# Patient Record
Sex: Male | Born: 1965 | Race: White | Hispanic: No | Marital: Married | Smoking: Former smoker
Health system: Southern US, Community
[De-identification: ages and names within clinical notes are randomized; demographics above are authoritative.]

## PROBLEM LIST (undated history)

## (undated) DIAGNOSIS — R011 Cardiac murmur, unspecified: Secondary | ICD-10-CM

## (undated) DIAGNOSIS — I10 Essential (primary) hypertension: Secondary | ICD-10-CM

## (undated) DIAGNOSIS — E785 Hyperlipidemia, unspecified: Secondary | ICD-10-CM

## (undated) HISTORY — DX: Hyperlipidemia, unspecified: E78.5

## (undated) HISTORY — DX: Essential (primary) hypertension: I10

## (undated) HISTORY — DX: Cardiac murmur, unspecified: R01.1

---

## 2005-07-10 ENCOUNTER — Encounter: Admission: RE | Admit: 2005-07-10 | Discharge: 2005-07-10 | Payer: Self-pay | Admitting: Emergency Medicine

## 2010-02-07 ENCOUNTER — Ambulatory Visit: Payer: Self-pay | Admitting: Internal Medicine

## 2010-02-07 DIAGNOSIS — F4010 Social phobia, unspecified: Secondary | ICD-10-CM

## 2010-06-19 NOTE — Assessment & Plan Note (Signed)
Summary: NEW / Mark Gomez  #   Vital Signs:  Patient profile:   45 year old male Height:      67 inches Weight:      188.25 pounds BMI:     29.59 O2 Sat:      97 % on Room air Temp:     97.0 degrees F oral Pulse rate:   72 / minute Pulse rhythm:   regular Resp:     16 per minute BP sitting:   120 / 72  (left arm) Cuff size:   large  Vitals Entered By: Rock Nephew CMA (February 07, 2010 3:58 PM)  Nutrition Counseling: Patient's BMI is greater than 25 and therefore counseled on weight management options.  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 When he goes to church and has to sit in a large crowd he breaks out in a cold sweat and feels nervous. He has an uncontrollable urge to get up and walk outside for some fresh air. This has been occurring for about 20 years and happens rarely but he notes that he has adjusted his life to avoid triggers (crowds, restaurants, parties, malls). He has tried cognitive therapy before but he did not make a good connection with the therapist and so he quit going.  Preventive Screening-Counseling & Management  Alcohol-Tobacco     Alcohol drinks/day: 1     Alcohol type: beer     >5/day in last 3 mos: no     Alcohol Counseling: not indicated; use of alcohol is not excessive or problematic     Feels need to cut down: no     Feels annoyed by complaints: no     Feels guilty re: drinking: no     Needs 'eye opener' in am: no     Smoking Status: never     Tobacco Counseling: not indicated; no tobacco use  Caffeine-Diet-Exercise     Does Patient Exercise: yes  Hep-HIV-STD-Contraception     Hepatitis Risk: no risk noted     HIV Risk: no risk noted     STD Risk: no risk noted      Sexual History:  currently monogamous.        Drug Use:  no.        Blood Transfusions:  no.    Medications Prior to Update: 1)   None  Current Medications (verified): 1)  Propranolol Hcl 10 Mg Tabs (Propranolol Hcl) .... One Tablet By Mouth Two Times A Day As Directed  Allergies (verified): No Known Drug Allergies  Past History:  Past Medical History: Unremarkable  Past Surgical History: Denies surgical history  Family History: none  Social History: Occupation: Curator Married Never Smoked Alcohol use-yes Drug use-no Regular exercise-yes Smoking Status:  never Hepatitis Risk:  no risk noted HIV Risk:  no risk noted STD Risk:  no risk noted Sexual History:  currently monogamous Blood Transfusions:  no Drug Use:  no Does Patient Exercise:  yes  Review of Systems  The patient denies anorexia, fever, weight loss, weight gain, chest pain, syncope, dyspnea on exertion, peripheral edema, prolonged cough, headaches, hemoptysis, abdominal pain, difficulty walking, and depression.   Psych:  Complains of panic attacks; denies alternate hallucination ( auditory/visual), anxiety, depression, easily angered, easily tearful, irritability, sense of great danger, suicidal thoughts/plans, and thoughts /plans of harming others.  Physical Exam  General:  alert, well-developed, well-nourished, well-hydrated, appropriate dress, normal appearance, and healthy-appearing.   Head:  normocephalic, atraumatic, no abnormalities observed, and no abnormalities palpated.   Mouth:  Oral mucosa and oropharynx without lesions or exudates.  Teeth in good repair. Neck:  supple, full ROM, no masses, no thyromegaly, and normal carotid upstroke.   Lungs:  Normal respiratory effort, chest expands symmetrically. Lungs are clear to auscultation, no crackles or wheezes. Heart:  Normal rate and regular rhythm. S1 and S2 normal without gallop, murmur, click, rub or other extra sounds. Abdomen:  soft, non-tender, normal bowel sounds, no distention, no masses, no guarding, no rigidity, no rebound tenderness, no abdominal hernia, no inguinal  hernia, no hepatomegaly, and no splenomegaly.   Msk:  No deformity or scoliosis noted of thoracic or lumbar spine.   Pulses:  R and L carotid,radial,femoral,dorsalis pedis and posterior tibial pulses are full and equal bilaterally Extremities:  No clubbing, cyanosis, edema, or deformity noted with normal full range of motion of all joints.   Neurologic:  No cranial nerve deficits noted. Station and gait are normal. Plantar reflexes are down-going bilaterally. DTRs are symmetrical throughout. Sensory, motor and coordinative functions appear intact. Skin:  Intact without suspicious lesions or rashes Cervical Nodes:  no anterior cervical adenopathy and no posterior cervical adenopathy.   Psych:  Oriented X3, memory intact for recent and remote, normally interactive, good eye contact, not depressed appearing, not agitated, not suicidal, not homicidal, and slightly anxious.     Impression & Recommendations:  Problem # 1:  SOCIAL PHOBIA (ICD-300.23) Assessment New try propranolol Orders: Psychology Referral (Psychology)  Complete Medication List: 1)  Propranolol Hcl 10 Mg Tabs (Propranolol hcl) .... One tablet by mouth two times a day as directed  Patient Instructions: 1)  Please schedule a follow-up appointment in 2 months. 2)  It is important that you exercise regularly at least 20 minutes 5 times a week. If you develop chest pain, have severe difficulty breathing, or feel very tired , stop exercising immediately and seek medical attention. 3)  You need to lose weight. Consider a lower calorie diet and regular exercise.  Prescriptions: PROPRANOLOL HCL 10 MG TABS (PROPRANOLOL HCL) One tablet by mouth two times a day as directed  #60 x 11   Entered and Authorized by:   Etta Grandchild MD   Signed by:   Etta Grandchild MD on 02/07/2010   Method used:   Print then Give to Patient   RxID:   [id number]

## 2015-02-23 ENCOUNTER — Ambulatory Visit
Admission: RE | Admit: 2015-02-23 | Discharge: 2015-02-23 | Disposition: A | Discharge status: Home / Self Care | Payer: BLUE CROSS/BLUE SHIELD | Source: Ambulatory Visit | Attending: Family Medicine | Admitting: Family Medicine

## 2015-02-23 ENCOUNTER — Other Ambulatory Visit: Payer: Self-pay | Admitting: Family Medicine

## 2015-02-23 DIAGNOSIS — R053 Chronic cough: Secondary | ICD-10-CM

## 2015-02-23 DIAGNOSIS — R05 Cough: Secondary | ICD-10-CM

## 2015-03-10 ENCOUNTER — Other Ambulatory Visit: Payer: Self-pay | Admitting: Family Medicine

## 2015-03-10 ENCOUNTER — Ambulatory Visit
Admission: RE | Admit: 2015-03-10 | Discharge: 2015-03-10 | Disposition: A | Discharge status: Home / Self Care | Payer: BLUE CROSS/BLUE SHIELD | Source: Ambulatory Visit | Attending: Family Medicine | Admitting: Family Medicine

## 2015-03-10 DIAGNOSIS — Z09 Encounter for follow-up examination after completed treatment for conditions other than malignant neoplasm: Secondary | ICD-10-CM

## 2016-05-17 IMAGING — CR DG CHEST 2V
2 series · 2 of 2 positions shown · non-contrast
Comparison: None.

CLINICAL DATA: Chronic cough.

EXAM:
CHEST  2 VIEW

[w chest pa]
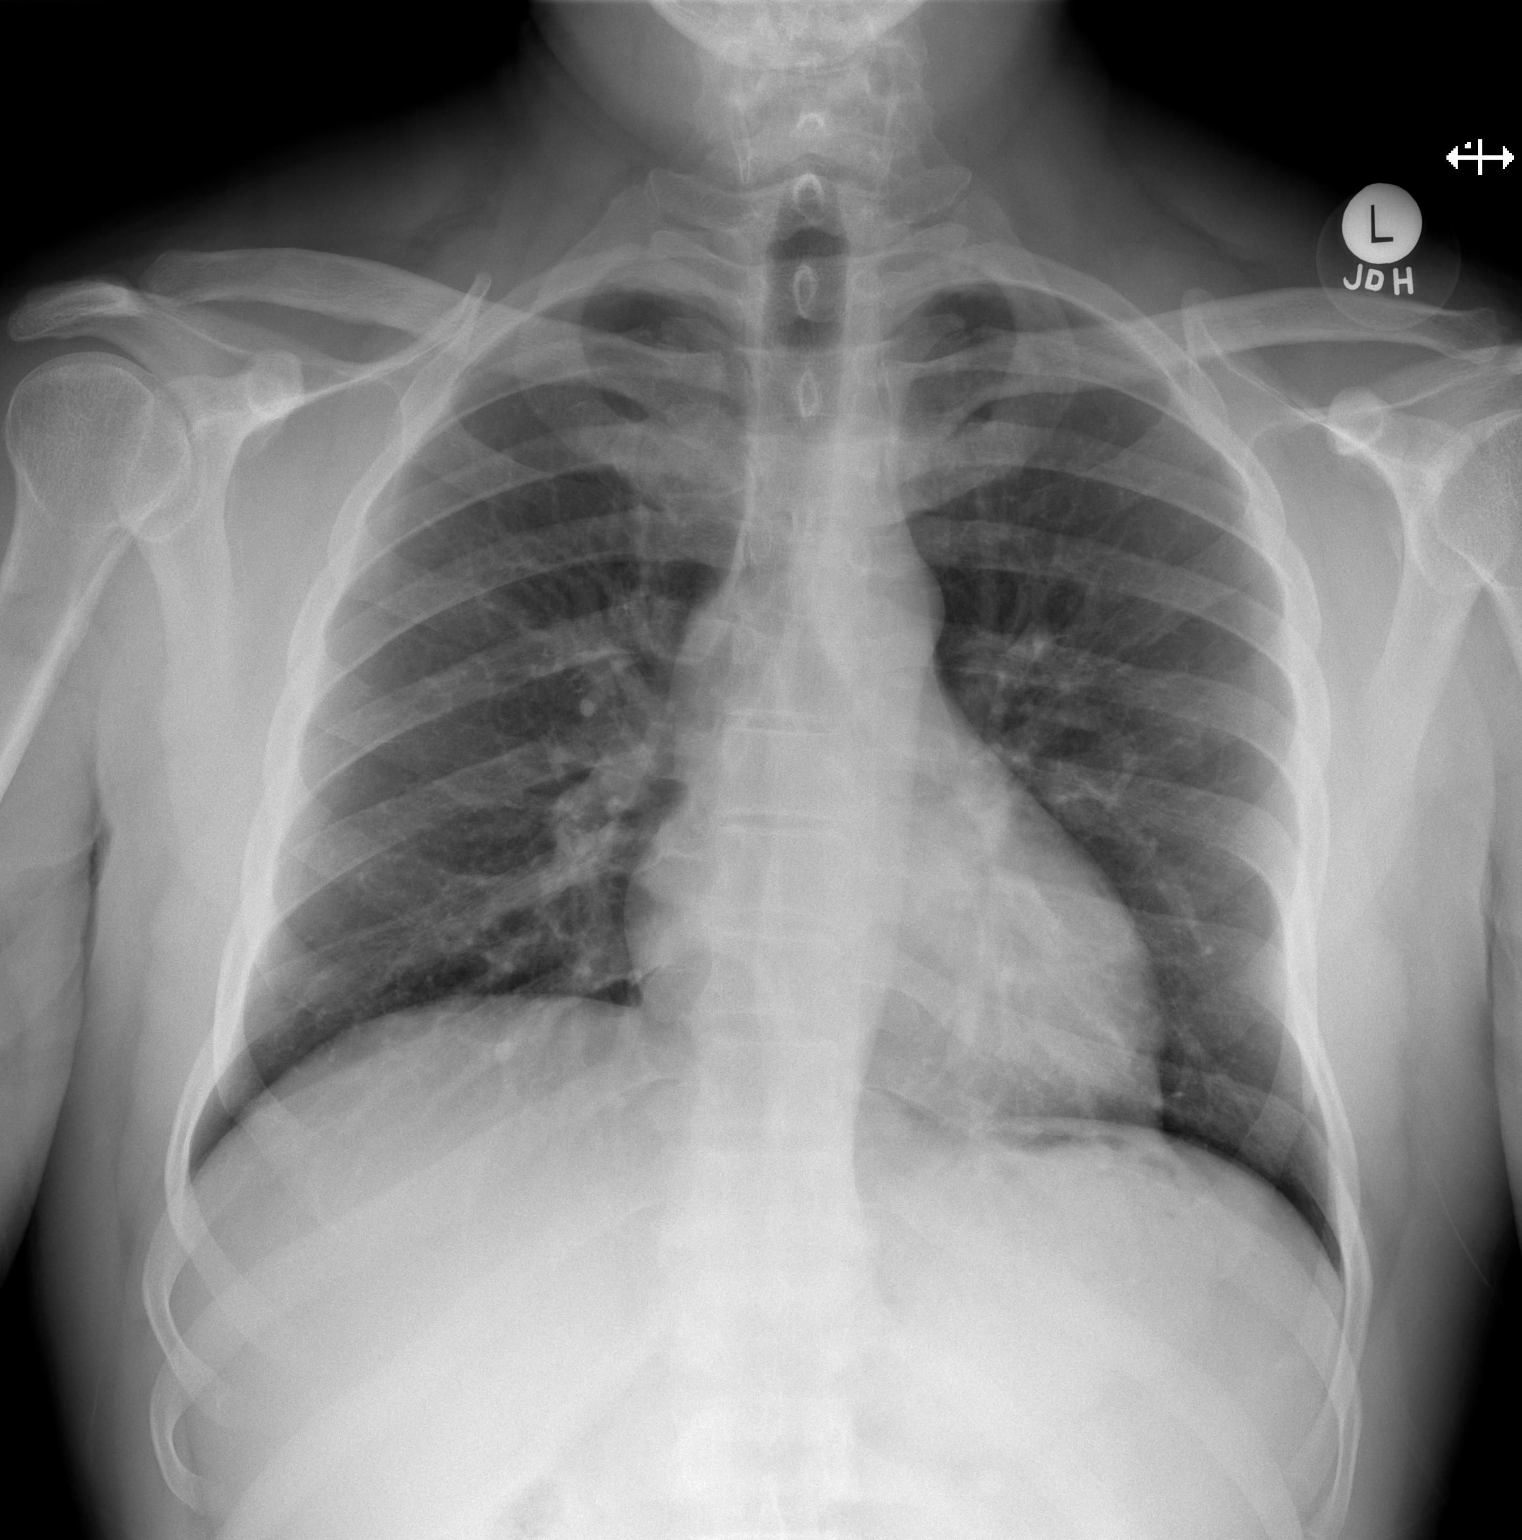

[w chest lat]
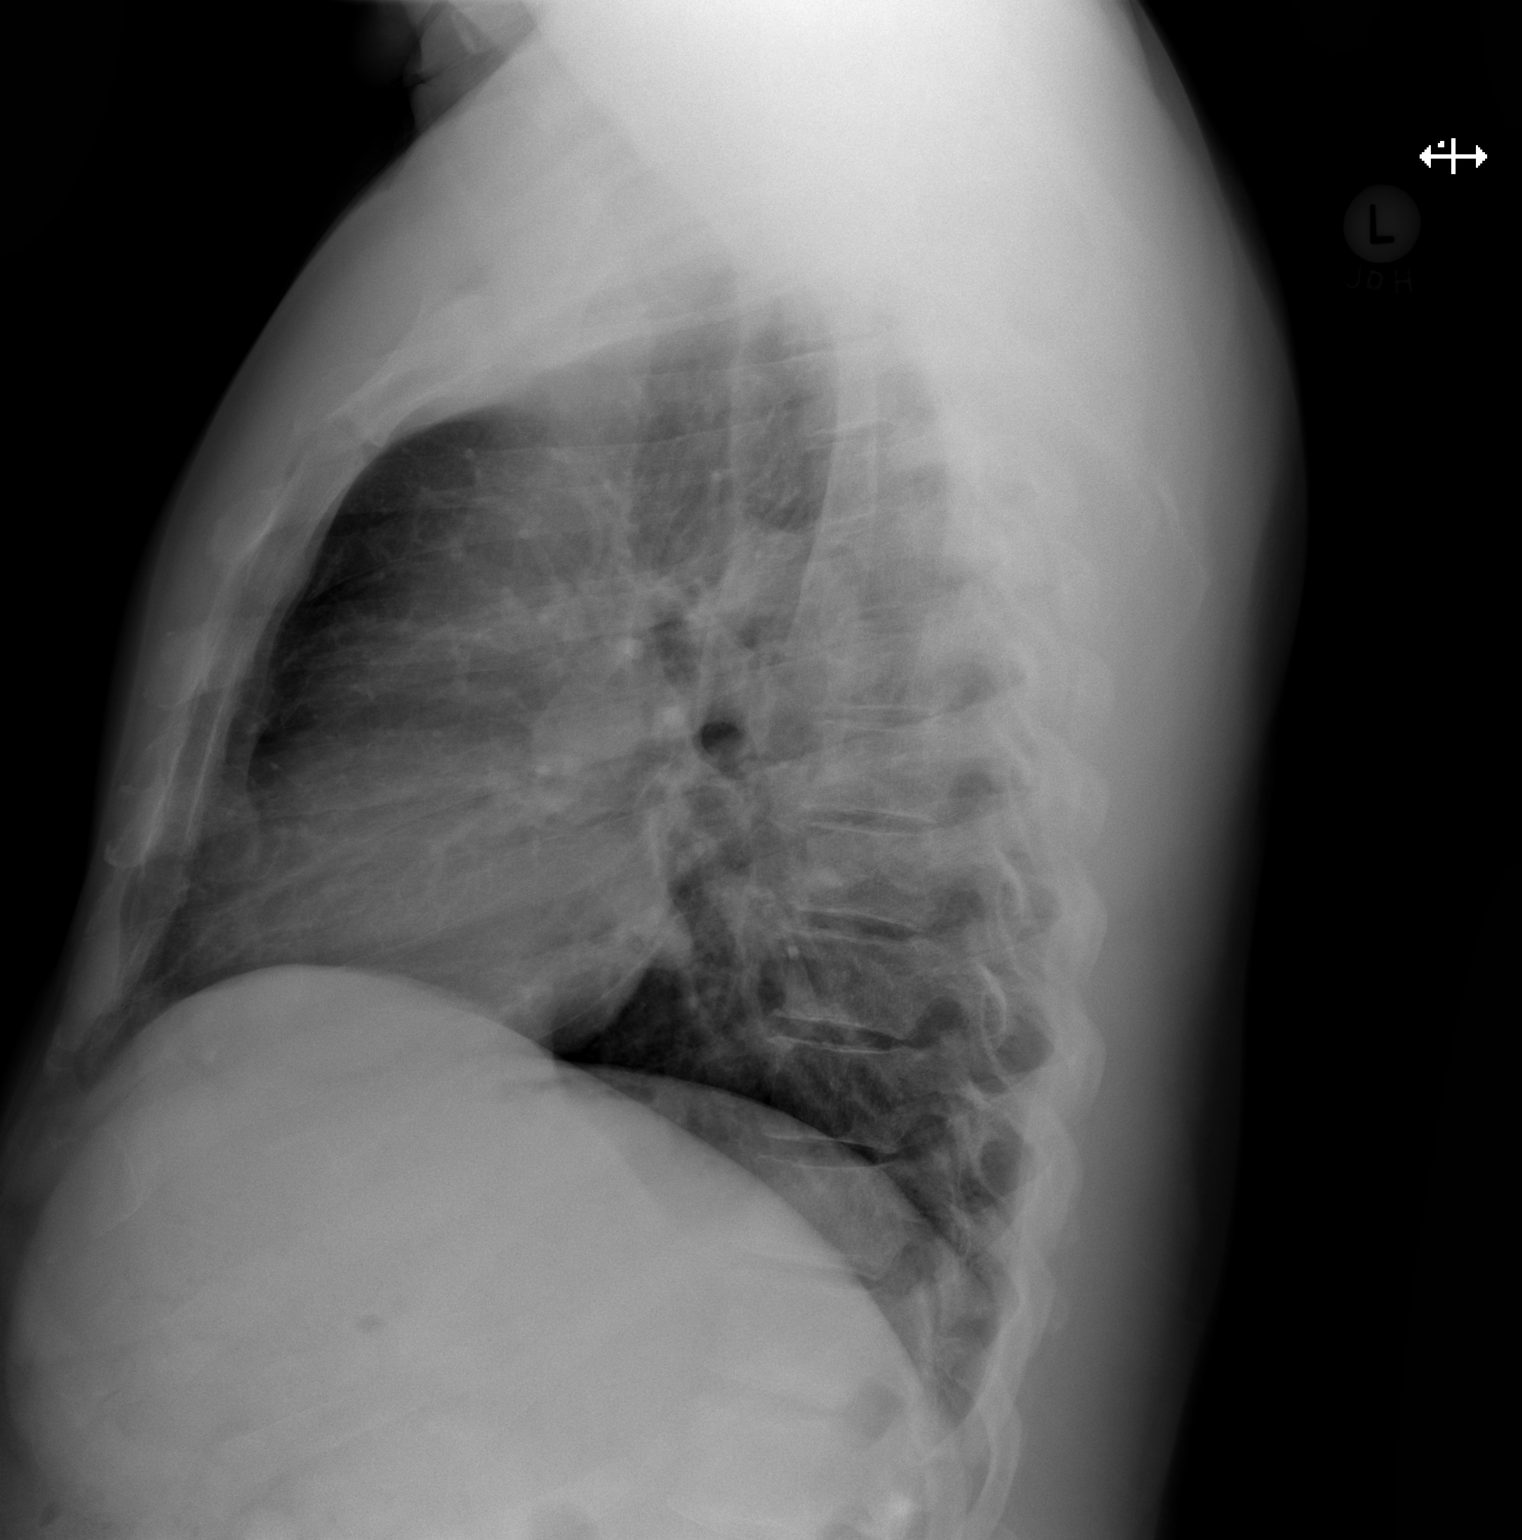

[2 of 2 positions shown; findings below may reference images not displayed]

FINDINGS: Mild increased density is noted along the anterior aspect of the
right first rib. This most likely prominent costosternal
calcification. Apical lordotic chest x-ray suggested to exclude
right upper lobe mass. Mild retrosternal soft tissue prominence,
most likely fat. Lungs are otherwise clear. Heart size normal. No
pleural effusion. No acute bony abnormality.
IMPRESSION: 1. Mild increased density noted along the anterior aspect of the
right first rib. This most likely prominent costosternal
calcification. Apical lordotic chest x-ray suggested to exclude an
underlying small right upper lobe mass.

2.  No acute cardiopulmonary disease.

## 2016-06-01 IMAGING — CR DG CHEST 1V
1 series · 1 of 1 positions shown · non-contrast
Comparison: 02/23/2015

CLINICAL DATA: Right upper lobe density on chest x-ray. Rule out
mass

EXAM:
CHEST 1 VIEW

[w chest ap]
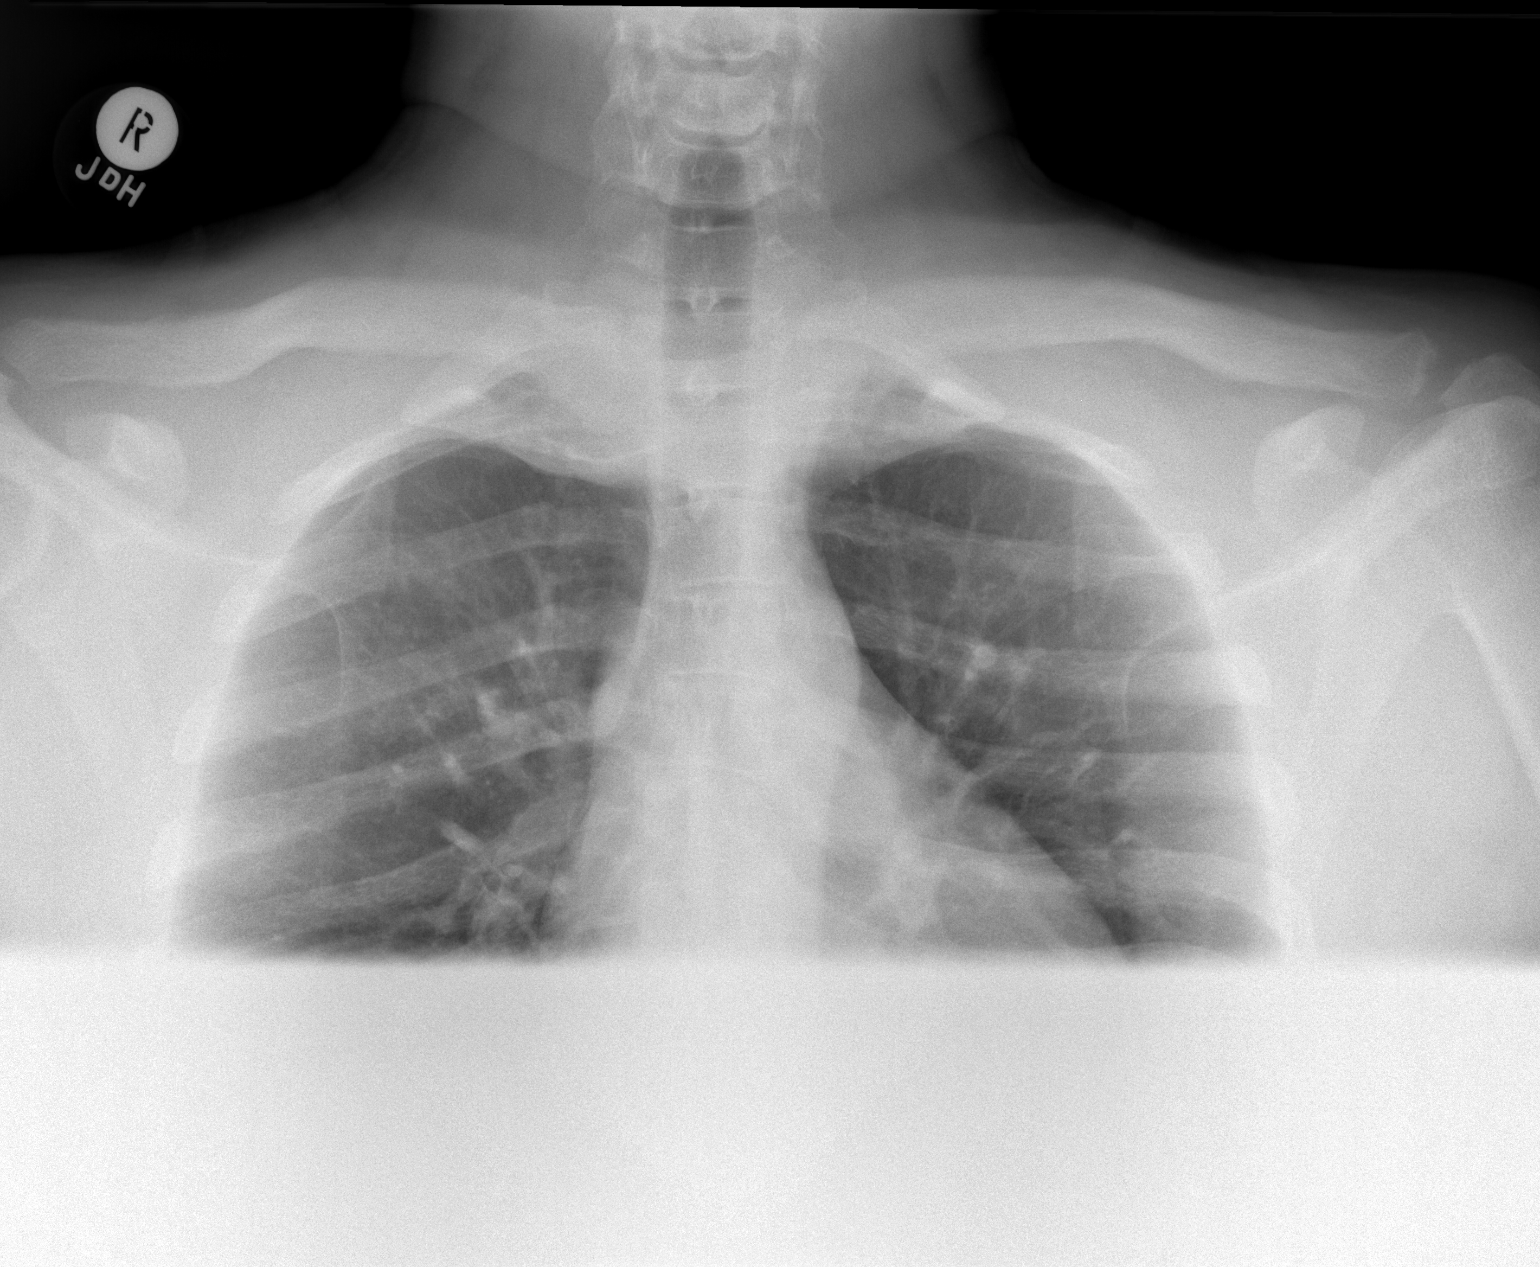

[1 of 1 positions shown; findings below may reference images not displayed]

FINDINGS: Apical lordotic view was obtained. No lung mass or infiltrate is
seen. There is increased density overlying the anterior first ribs
bilaterally due to costochondral calcification. There is a mild
amount of right apical pleural thickening.
IMPRESSION: Asymmetric apical density on the right felt to be due to pleural
thickening and costochondral calcification.

## 2017-01-03 DIAGNOSIS — E291 Testicular hypofunction: Secondary | ICD-10-CM | POA: Diagnosis not present

## 2017-01-03 DIAGNOSIS — I10 Essential (primary) hypertension: Secondary | ICD-10-CM | POA: Diagnosis not present

## 2017-01-03 DIAGNOSIS — Z125 Encounter for screening for malignant neoplasm of prostate: Secondary | ICD-10-CM | POA: Diagnosis not present

## 2017-01-03 DIAGNOSIS — Z5181 Encounter for therapeutic drug level monitoring: Secondary | ICD-10-CM | POA: Diagnosis not present

## 2017-01-03 DIAGNOSIS — R7303 Prediabetes: Secondary | ICD-10-CM | POA: Diagnosis not present

## 2017-01-27 MED FILL — GLYCOPYRROLATE 1 MG TABLET: 1 | 30 days supply | Qty: 60 | Fill #0

## 2017-01-27 MED FILL — LOSARTAN POTASSIUM 50 MG TA: 50 | 30 days supply | Qty: 30 | Fill #0

## 2017-02-12 MED FILL — TESTOSTERONE CYP 200 MG/ML: 200 | 70 days supply | Qty: 10 | Fill #0

## 2017-03-19 MED FILL — LOSARTAN POTASSIUM 50 MG TA: 50 | 30 days supply | Qty: 30 | Fill #1

## 2017-04-04 MED FILL — METOPROLOL TARTRATE 25 MG T: 25 | 30 days supply | Qty: 60 | Fill #0

## 2017-04-18 DIAGNOSIS — I10 Essential (primary) hypertension: Secondary | ICD-10-CM | POA: Diagnosis not present

## 2017-05-07 MED FILL — METOPROLOL TARTRATE 25 MG T: 25 | 90 days supply | Qty: 180 | Fill #0

## 2017-06-26 DIAGNOSIS — Z5181 Encounter for therapeutic drug level monitoring: Secondary | ICD-10-CM | POA: Diagnosis not present

## 2017-06-26 DIAGNOSIS — E291 Testicular hypofunction: Secondary | ICD-10-CM | POA: Diagnosis not present

## 2017-06-26 DIAGNOSIS — Z125 Encounter for screening for malignant neoplasm of prostate: Secondary | ICD-10-CM | POA: Diagnosis not present

## 2017-06-26 DIAGNOSIS — I10 Essential (primary) hypertension: Secondary | ICD-10-CM | POA: Diagnosis not present

## 2017-06-26 DIAGNOSIS — R7303 Prediabetes: Secondary | ICD-10-CM | POA: Diagnosis not present

## 2017-06-27 MED FILL — TESTOSTERONE CYP 200 MG/ML: 200 | 70 days supply | Qty: 10 | Fill #0

## 2017-07-22 DIAGNOSIS — I10 Essential (primary) hypertension: Secondary | ICD-10-CM | POA: Diagnosis not present

## 2017-07-22 DIAGNOSIS — E291 Testicular hypofunction: Secondary | ICD-10-CM | POA: Diagnosis not present

## 2017-09-15 MED FILL — METOPROLOL TARTRATE 25 MG T: 25 | 90 days supply | Qty: 180 | Fill #1

## 2017-10-24 ENCOUNTER — Ambulatory Visit (INDEPENDENT_AMBULATORY_CARE_PROVIDER_SITE_OTHER): Payer: 59 | Admitting: Pulmonary Disease

## 2017-10-24 ENCOUNTER — Encounter: Payer: Self-pay | Admitting: Pulmonary Disease

## 2017-10-24 ENCOUNTER — Ambulatory Visit (INDEPENDENT_AMBULATORY_CARE_PROVIDER_SITE_OTHER)
Admission: RE | Admit: 2017-10-24 | Discharge: 2017-10-24 | Disposition: A | Discharge status: Home / Self Care | Payer: 59 | Source: Ambulatory Visit | Attending: Pulmonary Disease | Admitting: Pulmonary Disease

## 2017-10-24 VITALS — BP 150/90 | HR 67 | Ht 67.0 in | Wt 195.0 lb

## 2017-10-24 DIAGNOSIS — R05 Cough: Secondary | ICD-10-CM

## 2017-10-24 DIAGNOSIS — Z77090 Contact with and (suspected) exposure to asbestos: Secondary | ICD-10-CM | POA: Diagnosis not present

## 2017-10-24 DIAGNOSIS — R059 Cough, unspecified: Secondary | ICD-10-CM

## 2017-10-24 LAB — NITRIC OXIDE: Nitric Oxide: 20

## 2017-10-24 MED FILL — TESTOSTERONE CYP 200 MG/ML: 200 | 70 days supply | Qty: 10 | Fill #1

## 2017-10-24 NOTE — Patient Instructions (Signed)
Cough: We will check a chest x-ray Because of your history of asthma with a cough we will check and exhaled nitric oxide test and a spirometry test If those tests are all normal then I think you should treat this as if it is silent acid reflux: Take Pepcid over-the-counter twice a day for a month Follow the gastroesophageal reflux disease lifestyle modification sheet we gave you  We will see you back in 3 to 4 weeks to see how you are doing

## 2017-10-24 NOTE — Progress Notes (Signed)
Synopsis: Referred in June 2019 for Cough.  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  Briefly smokes cigarettes as a teenager, about 1/2 pack a day for 6 years  Subjective:   PATIENT ID: Mark Gomez GENDER: male DOB: 09-May-1966, MRN: [id number]   HPI  Chief Complaint  Patient presents with  . New Consult    chronic cough    Mark Gomez is here to see me for a cough that has lasted for 4-5 years.  It will come and go.  Typically if he gets a cold it will "go to his chest".  He says that today when he was eating sandwich he had to cough up some mucus production. > typically the cough is dry > he recently had a cold and had some mucus production > it is sometimes yellow > he thinks that the cough has been going on 5-6 years tops > no mold in the house, though symptoms occurred prior to that. He says he does remember an episode with a cough a few weeks ago when he turned on an air conditioning unit in cold air hit him in the face and this made him cough quite a bit.  He smoked briefly as a teenager.    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  He remembers having asthma problems as a kid and used an albuterol like medicine.  He says he grew out of this as a kid.  He raises dogs: keeps hypo-allergenic dogs in the house.  He says he currently has a dogs in the house, they stay most of the time in the garage.  They do not go in the bedroom.  He denies sinus symptoms.  He has noticed no heartburn.   Sometimes he feels a little short breath but this is only with very heavy exertion.    Past Medical History:  Diagnosis Date  . Heart murmur   . Hyperlipidemia   . Hypertension      History reviewed. No pertinent family history.   Social History   Socioeconomic History  . Marital  status: Married    Spouse name: Not on file  . Number of children: Not on file  . Years of education: Not on file  . Highest education level: Not on file  Occupational History  . Not on file  Social Needs  . Financial resource strain: Not on file  . Food insecurity:    Worry: Not on file    Inability: Not on file  . Transportation needs:    Medical: Not on file    Non-medical: Not on file  Tobacco Use  . Smoking status: Former Smoker    Packs/day: 0.50    Years: 6.00    Pack years: 3.00  . Smokeless tobacco: Never Used  Substance and Sexual Activity  . Alcohol use: Not on file  . Drug use: Not on file  . Sexual activity: Not on file  Lifestyle  . Physical activity:    Days per week: Not on file    Minutes per session: Not on file  . Stress: Not on file  Relationships  . Social connections:    Talks on phone: Not on file    Gets together: Not on file  Attends religious service: Not on file    Active member of club or organization: Not on file    Attends meetings of clubs or organizations: Not on file    Relationship status: Not on file  . Intimate partner violence:    Fear of current or ex partner: Not on file    Emotionally abused: Not on file    Physically abused: Not on file    Forced sexual activity: Not on file  Other Topics Concern  . Not on file  Social History Narrative  . Not on file     No Known Allergies   Outpatient Medications Prior to Visit  Medication Sig Dispense Refill  . metoprolol tartrate (LOPRESSOR) 100 MG tablet Take 100 mg by mouth 2 (two) times daily.    Marland Kitchen glycopyrrolate (ROBINUL) 1 MG tablet Take 1 mg by mouth 2 (two) times daily as needed.     No facility-administered medications prior to visit.     Review of Systems  Constitutional: Negative for fever and weight loss.  HENT: Positive for congestion. Negative for ear pain, nosebleeds and sore throat.   Eyes: Negative for redness.  Respiratory: Positive for cough and wheezing.  Negative for shortness of breath.   Cardiovascular: Negative for palpitations, leg swelling and PND.  Gastrointestinal: Negative for nausea and vomiting.  Genitourinary: Negative for dysuria.  Skin: Negative for rash.  Neurological: Negative for headaches.  Endo/Heme/Allergies: Does not bruise/bleed easily.  Psychiatric/Behavioral: Negative for depression. The patient is not nervous/anxious.       Objective:  Physical Exam   Vitals:   10/24/17 1534  BP: (!) 150/90  Pulse: 67  SpO2: 99%  Weight: 195 lb (88.5 kg)  Height: 5\' 7"  (1.702 m)    Gen: well appearing, no acute distress HENT: NCAT, OP clear, neck supple without masses Eyes: PERRL, EOMi Lymph: no cervical lymphadenopathy PULM: CTA B CV: RRR, no mgr, no JVD GI: BS+, soft, nontender, no hsm Derm: no rash or skin breakdown MSK: normal bulk and tone Neuro: A&Ox4, CN II-XII intact, strength 5/5 in all 4 extremities Psyche: normal mood and affect   CBC No results found for: WBC, RBC, HGB, HCT, PLT, MCV, MCH, MCHC, RDW, LYMPHSABS, MONOABS, EOSABS, BASOSABS   Chest imaging: 2016 chest x-ray showed right upper lobe costochondral calcification I have personally reviewed the images from this chest x-ray and I have a difficult time seeing this.  Pulmonary parenchyma seems normal otherwise.  PFT:  Labs:  Path:  Echo:  Heart Catheterization:       Assessment & Plan:   Cough  Asbestos exposure  Discussion: Mark Gomez presents today for evaluation of a cough.  He has a history of asthma as a child and he reports symptoms which are sometimes exacerbated by cold air or eating.  Otherwise the cough is fairly unpredictable.  Because of his history of asthma and the fact that he will get chest symptoms after an upper respiratory infection I would like to assess for asthma.  Further, considering his prior history of heavy asbestos exposure I think we should get a chest x-ray.  Of note there was a read on a chest x-ray  in October 2016 which was apparently abnormal but by my review of the images I cannot find a clear abnormality.  I explained to him today that the causes of cough for many and include postnasal drip, acid reflux and lung disease among others.  In his particular case and most concerned about the  possibility of asthma given his personal history and acid reflux.  Plan: Cough: We will check a chest x-ray Because of your history of asthma with a cough we will check and exhaled nitric oxide test and a spirometry test If those tests are all normal then I think you should treat this as if it is silent acid reflux: Take Pepcid over-the-counter twice a day for a month Follow the gastroesophageal reflux disease lifestyle modification sheet we gave you  We will see you back in 3 to 4 weeks to see how you are doing    Current Outpatient Medications:  .  metoprolol tartrate (LOPRESSOR) 100 MG tablet, Take 100 mg by mouth 2 (two) times daily., Disp: , Rfl:

## 2017-10-27 ENCOUNTER — Telehealth: Payer: Self-pay | Admitting: Pulmonary Disease

## 2017-10-27 NOTE — Telephone Encounter (Signed)
Spoke with patient. He is aware of results. Nothing else needed at time of call.  

## 2017-10-27 NOTE — Telephone Encounter (Signed)
Normal, I looked at the images myself to be sure

## 2017-10-27 NOTE — Telephone Encounter (Signed)
Called pt to see which results he was referring to. He would like the results from his checs xray. I will send message to Dr. Kendrick FriesMcQuaid to review so we can call pt to let him know.   Please advise Dr. Kendrick FriesMcQuaid

## 2017-12-01 ENCOUNTER — Encounter: Payer: Self-pay | Admitting: Pulmonary Disease

## 2017-12-01 ENCOUNTER — Ambulatory Visit (INDEPENDENT_AMBULATORY_CARE_PROVIDER_SITE_OTHER): Payer: 59 | Admitting: Pulmonary Disease

## 2017-12-01 VITALS — BP 132/90 | HR 64 | Ht 67.0 in | Wt 193.0 lb

## 2017-12-01 DIAGNOSIS — R05 Cough: Secondary | ICD-10-CM

## 2017-12-01 DIAGNOSIS — R059 Cough, unspecified: Secondary | ICD-10-CM

## 2017-12-01 NOTE — Patient Instructions (Signed)
Cough: I see no evidence of underlying lung disease I think the cough is due to heightened laryngeal irritation and sensitivity after a viral illness  In the event of a cold I recommend the following:  For sinus congestion: Saline rinses/sprays Over-the-counter phenylephrine If that is not effective then consider using pseudoephedrine  For cough: Use dextromethorphan twice a day  For excessive mucus in your chest or sinuses: Use guaifenesin twice a day  If these treatments are ineffective then come back to see us if the problem recurs again.  At that point we may consider having a speech therapist evaluate your swallowing as you say that swallowing liquid and solids will sometimes exacerbate the cough when you are having problems with it.  Follow-up on an as-needed basis

## 2017-12-01 NOTE — Progress Notes (Signed)
Synopsis: Referred in June 2019 for Cough.  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  Briefly smokes cigarettes as a teenager, about 1/2 pack a day for 6 years  Subjective:   PATIENT ID: Mark Gomez GENDER: male DOB: [birth year], MRN: [id number]   HPI  Chief Complaint  Patient presents with  . Follow-up    cough has been gone for about week     Mark Gomez is doing well.  He says that he took the Pepcid over-the-counter for about a week and a half and he says that he really did not feel that this made much of a difference in his cough so he stopped taking it.  However, around the time he stopped taking the pill he says the cough went away.  He has not had it since then.  He says that he is concerned that this will come back again though because he has had this problem repeatedly over the last several years.  About every 4 to 6 months we will get a cough after a cold that will persist for several months.  He says typically the cough worsens during these episodes when eating or drinking.  Currently he is cough-free.  Past Medical History:  Diagnosis Date  . Heart murmur   . Hyperlipidemia   . Hypertension       Review of Systems  Constitutional: Negative for fever and weight loss.  HENT: Positive for congestion. Negative for ear pain, nosebleeds and sore throat.   Eyes: Negative for redness.  Respiratory: Positive for cough and wheezing. Negative for shortness of breath.   Cardiovascular: Negative for palpitations, leg swelling and PND.  Gastrointestinal: Negative for nausea and vomiting.  Genitourinary: Negative for dysuria.  Skin: Negative for rash.  Neurological: Negative for headaches.  Endo/Heme/Allergies: Does not bruise/bleed easily.  Psychiatric/Behavioral: Negative for depression. The patient is not nervous/anxious.       Objective:  Physical Exam   Vitals:   12/01/17 1006  BP:  132/90  Pulse: 64  SpO2: 98%  Weight: 193 lb (87.5 kg)  Height: 5\' 7"  (1.702 m)    RA  Gen: well appearing HENT: OP clear, TM's clear, neck supple PULM: CTA B, normal percussion CV: RRR, no mgr, trace edema GI: BS+, soft, nontender Derm: no cyanosis or rash Psyche: normal mood and affect    CBC No results found for: WBC, RBC, HGB, HCT, PLT, MCV, MCH, MCHC, RDW, LYMPHSABS, MONOABS, EOSABS, BASOSABS   Chest imaging: 2016 chest x-ray showed right upper lobe costochondral calcification I have personally reviewed the images from this chest x-ray and I have a difficult time seeing this.  Pulmonary parenchyma seems normal otherwise. 2019 CXR images reviewed: normal  PFT: 10/2017 Spirometry: normal  FENO: 10/2017 20 ppm  Labs:  Path:  Echo:  Heart Catheterization:       Assessment & Plan:   Cough  Discussion: Mark Gomez has no evidence of an underlying lung disease which could be causing his recurrent problem with cough after viral illnesses.  Treatment of acid reflux did not make much of a difference, the cough does seem to go away spontaneously.  I explained to him today that I think he has a heightened laryngeal sense of irritation whenever he gets a cough or cold.  So I think the best form of treatment moving forward is to be aggressive with symptomatic therapy in the event of a  viral illness.  Because he says the cough will sometimes be exacerbated by swallowing difficulty there may be value in having a speech therapist see him if this problem recurs again.  Plan: Cough: I see no evidence of underlying lung disease I think the cough is due to heightened laryngeal irritation and sensitivity after a viral illness  In the event of a cold I recommend the following:  For sinus congestion: Saline rinses/sprays Over-the-counter phenylephrine If that is not effective then consider using pseudoephedrine  For cough: Use dextromethorphan twice a day  For excessive  mucus in your chest or sinuses: Use guaifenesin twice a day  If these treatments are ineffective then come back to see [place] if the problem recurs again.  At that point we may consider having a speech therapist evaluate your swallowing as you say that swallowing liquid and solids will sometimes exacerbate the cough when you are having problems with it.  Follow-up on an as-needed basis   Current Outpatient Medications:  .  metoprolol tartrate (LOPRESSOR) 100 MG tablet, Take 100 mg by mouth 2 (two) times daily., Disp: , Rfl:  .  testosterone cypionate (DEPOTESTOTERONE CYPIONATE) 100 MG/ML injection, Inject 200 mg into the muscle every 14 (fourteen) days. For IM use only, Disp: , Rfl:

## 2017-12-26 DIAGNOSIS — E291 Testicular hypofunction: Secondary | ICD-10-CM | POA: Diagnosis not present

## 2017-12-26 DIAGNOSIS — I10 Essential (primary) hypertension: Secondary | ICD-10-CM | POA: Diagnosis not present

## 2017-12-26 DIAGNOSIS — Z125 Encounter for screening for malignant neoplasm of prostate: Secondary | ICD-10-CM | POA: Diagnosis not present

## 2017-12-26 DIAGNOSIS — Z79899 Other long term (current) drug therapy: Secondary | ICD-10-CM | POA: Diagnosis not present

## 2017-12-29 DIAGNOSIS — E291 Testicular hypofunction: Secondary | ICD-10-CM | POA: Diagnosis not present

## 2017-12-29 DIAGNOSIS — Z79899 Other long term (current) drug therapy: Secondary | ICD-10-CM | POA: Diagnosis not present

## 2017-12-29 DIAGNOSIS — Z125 Encounter for screening for malignant neoplasm of prostate: Secondary | ICD-10-CM | POA: Diagnosis not present

## 2018-01-15 MED FILL — METOPROLOL TARTRATE 25 MG T: 25 | 90 days supply | Qty: 180 | Fill #0

## 2018-01-20 DIAGNOSIS — I10 Essential (primary) hypertension: Secondary | ICD-10-CM | POA: Diagnosis not present

## 2018-03-30 MED FILL — TESTOSTERONE CYP 200 MG/ML: 200 | 70 days supply | Qty: 10 | Fill #0

## 2018-05-08 MED FILL — METOPROLOL TARTRATE 25 MG T: 25 | 90 days supply | Qty: 180 | Fill #0

## 2018-07-20 MED FILL — TESTOSTERONE CYP 200 MG/ML: 200 | 70 days supply | Qty: 10 | Fill #1

## 2018-07-22 MED FILL — GLYCOPYRROLATE 1 MG TABLET: 1 | 90 days supply | Qty: 180 | Fill #0

## 2018-08-27 MED FILL — METOPROLOL TARTRATE 25 MG T: 25 | 90 days supply | Qty: 180 | Fill #0

## 2018-09-28 MED FILL — METHOCARBAMOL 500 MG TABLET: 500 | 5 days supply | Qty: 20 | Fill #0

## 2018-12-24 MED FILL — METOPROLOL TARTRATE 25 MG T: 25 | 90 days supply | Qty: 180 | Fill #0

## 2019-03-15 MED FILL — TESTOSTERONE CYP 200 MG/ML: 200 | 70 days supply | Qty: 10 | Fill #0

## 2019-04-26 MED FILL — METOPROLOL TARTRATE 25 MG T: 25 | 90 days supply | Qty: 180 | Fill #0

## 2019-07-29 MED FILL — TESTOSTERONE CYP 200 MG/ML: 200 | 70 days supply | Qty: 10 | Fill #1

## 2019-08-11 MED FILL — TESTOSTERONE CYP 200 MG/ML: 200 | 70 days supply | Qty: 10 | Fill #1

## 2019-08-23 MED FILL — METOPROLOL TARTRATE 25 MG T: 25 | 90 days supply | Qty: 180 | Fill #1

## 2019-12-09 ENCOUNTER — Other Ambulatory Visit (HOSPITAL_COMMUNITY): Payer: Self-pay | Admitting: Family Medicine

## 2019-12-09 MED FILL — METOPROLOL TARTRATE 25 MG T: 25 | 90 days supply | Qty: 180 | Fill #0

## 2019-12-15 MED FILL — METOPROLOL TARTRATE 25 MG T: 25 | 90 days supply | Qty: 180 | Fill #0

## 2019-12-15 MED FILL — METOPROLOL TARTRATE 25 MG T: 25 | 90 days supply | Qty: 180 | Fill #0 | Status: TO

## 2020-02-10 MED FILL — TESTOSTERONE CYP 200 MG/ML: 200 | 84 days supply | Qty: 12 | Fill #0

## 2020-02-29 ENCOUNTER — Other Ambulatory Visit (HOSPITAL_COMMUNITY): Payer: Self-pay | Admitting: Family Medicine

## 2020-03-02 MED FILL — GLYCOPYRROLATE 1 MG TABLET: 1 | 90 days supply | Qty: 180 | Fill #0

## 2020-04-19 MED FILL — METOPROLOL TARTRATE 25 MG T: 25 | 90 days supply | Qty: 180 | Fill #1

## 2020-05-09 MED FILL — TESTOSTERONE CYP 200 MG/ML: 200 | 56 days supply | Qty: 8 | Fill #1

## 2020-08-02 ENCOUNTER — Other Ambulatory Visit (HOSPITAL_COMMUNITY): Payer: Self-pay | Admitting: Family Medicine

## 2020-08-17 ENCOUNTER — Other Ambulatory Visit (HOSPITAL_COMMUNITY): Payer: Self-pay | Admitting: Nurse Practitioner

## 2020-08-17 MED FILL — METHOCARBAMOL 500 MG TABS: 500 | 5 days supply | Qty: 20 | Fill #0

## 2020-08-17 MED FILL — predniSONE 10 MG TABS: 10 | 6 days supply | Qty: 21 | Fill #0

## 2020-08-18 ENCOUNTER — Other Ambulatory Visit (HOSPITAL_COMMUNITY): Payer: Self-pay | Admitting: Nurse Practitioner

## 2020-08-18 MED FILL — traMADol HCL 50 MG TABS: 50 | 30 days supply | Qty: 60 | Fill #0

## 2020-09-07 ENCOUNTER — Other Ambulatory Visit (HOSPITAL_COMMUNITY): Payer: Self-pay

## 2020-09-08 ENCOUNTER — Other Ambulatory Visit (HOSPITAL_COMMUNITY): Payer: Self-pay

## 2020-09-08 MED ORDER — METOPROLOL TARTRATE 25 MG PO TABS
25.0000 mg | ORAL_TABLET | Freq: Two times a day (BID) | ORAL | 0 refills | Status: DC
  Filled 2020-09-08: qty 180, 90d supply, fill #0

## 2020-09-12 ENCOUNTER — Other Ambulatory Visit (HOSPITAL_COMMUNITY): Payer: Self-pay

## 2021-01-08 ENCOUNTER — Other Ambulatory Visit (HOSPITAL_COMMUNITY): Payer: Self-pay

## 2021-01-08 MED ORDER — METOPROLOL TARTRATE 25 MG PO TABS
25.0000 mg | ORAL_TABLET | Freq: Two times a day (BID) | ORAL | 0 refills | Status: DC
  Filled 2021-01-08: qty 180, 90d supply, fill #0

## 2021-01-08 MED FILL — Glycopyrrolate Tab 1 MG: ORAL | 90 days supply | Qty: 180 | Fill #0 | Status: AC

## 2021-01-09 ENCOUNTER — Other Ambulatory Visit (HOSPITAL_COMMUNITY): Payer: Self-pay

## 2021-04-05 ENCOUNTER — Other Ambulatory Visit (HOSPITAL_COMMUNITY): Payer: Self-pay

## 2021-04-05 MED ORDER — TESTOSTERONE CYPIONATE 200 MG/ML IM SOLN
200.0000 mg | INTRAMUSCULAR | 1 refills | Status: DC
  Filled 2021-04-05: qty 12, 84d supply, fill #0
  Filled 2021-08-20: qty 6, 84d supply, fill #1

## 2021-04-16 ENCOUNTER — Other Ambulatory Visit (HOSPITAL_COMMUNITY): Payer: Self-pay

## 2021-04-16 MED ORDER — METOPROLOL TARTRATE 25 MG PO TABS
25.0000 mg | ORAL_TABLET | Freq: Two times a day (BID) | ORAL | 0 refills | Status: DC
  Filled 2021-04-16: qty 180, 90d supply, fill #0

## 2021-08-09 ENCOUNTER — Other Ambulatory Visit (HOSPITAL_COMMUNITY): Payer: Self-pay

## 2021-08-10 ENCOUNTER — Other Ambulatory Visit (HOSPITAL_COMMUNITY): Payer: Self-pay

## 2021-08-10 MED ORDER — METOPROLOL TARTRATE 25 MG PO TABS
25.0000 mg | ORAL_TABLET | Freq: Two times a day (BID) | ORAL | 0 refills | Status: DC
  Filled 2021-08-10: qty 180, 90d supply, fill #0

## 2021-08-20 ENCOUNTER — Other Ambulatory Visit (HOSPITAL_COMMUNITY): Payer: Self-pay

## 2021-08-21 ENCOUNTER — Other Ambulatory Visit (HOSPITAL_COMMUNITY): Payer: Self-pay

## 2021-08-23 ENCOUNTER — Other Ambulatory Visit (HOSPITAL_COMMUNITY): Payer: Self-pay

## 2021-08-24 ENCOUNTER — Other Ambulatory Visit (HOSPITAL_COMMUNITY): Payer: Self-pay

## 2021-08-27 ENCOUNTER — Other Ambulatory Visit (HOSPITAL_COMMUNITY): Payer: Self-pay

## 2021-08-27 MED ORDER — GLYCOPYRROLATE 1 MG PO TABS
1.0000 mg | ORAL_TABLET | Freq: Two times a day (BID) | ORAL | 1 refills | Status: DC
  Filled 2021-08-27: qty 180, 90d supply, fill #0

## 2021-08-28 ENCOUNTER — Other Ambulatory Visit (HOSPITAL_COMMUNITY): Payer: Self-pay

## 2021-09-25 ENCOUNTER — Other Ambulatory Visit (HOSPITAL_COMMUNITY): Payer: Self-pay

## 2021-09-25 MED ORDER — AMLODIPINE BESYLATE 5 MG PO TABS
5.0000 mg | ORAL_TABLET | Freq: Every day | ORAL | 1 refills | Status: DC
  Filled 2021-09-25: qty 30, 30d supply, fill #0

## 2021-10-22 ENCOUNTER — Other Ambulatory Visit (HOSPITAL_COMMUNITY): Payer: Self-pay

## 2021-10-22 MED ORDER — HYDROCHLOROTHIAZIDE 12.5 MG PO CAPS
12.5000 mg | ORAL_CAPSULE | Freq: Every morning | ORAL | 2 refills | Status: DC
  Filled 2021-10-22: qty 30, 30d supply, fill #0

## 2021-10-23 ENCOUNTER — Other Ambulatory Visit (HOSPITAL_BASED_OUTPATIENT_CLINIC_OR_DEPARTMENT_OTHER): Payer: Self-pay

## 2021-11-02 ENCOUNTER — Other Ambulatory Visit (HOSPITAL_COMMUNITY): Payer: Self-pay

## 2021-11-02 MED ORDER — CLONIDINE HCL 0.1 MG PO TABS
0.1000 mg | ORAL_TABLET | Freq: Two times a day (BID) | ORAL | 0 refills | Status: DC
  Filled 2021-11-02: qty 60, 30d supply, fill #0

## 2021-11-19 ENCOUNTER — Other Ambulatory Visit (HOSPITAL_COMMUNITY): Payer: Self-pay

## 2021-11-21 ENCOUNTER — Other Ambulatory Visit (HOSPITAL_COMMUNITY): Payer: Self-pay

## 2021-11-21 MED ORDER — METOPROLOL TARTRATE 25 MG PO TABS
25.0000 mg | ORAL_TABLET | Freq: Two times a day (BID) | ORAL | 0 refills | Status: DC
  Filled 2021-11-21: qty 180, 90d supply, fill #0

## 2022-01-02 ENCOUNTER — Other Ambulatory Visit (HOSPITAL_COMMUNITY): Payer: Self-pay

## 2022-01-02 MED ORDER — TESTOSTERONE CYPIONATE 200 MG/ML IM SOLN
200.0000 mg | INTRAMUSCULAR | 1 refills | Status: DC
  Filled 2022-01-02: qty 6, 84d supply, fill #0
  Filled 2022-03-19 – 2022-03-25 (×2): qty 6, 84d supply, fill #1

## 2022-03-13 ENCOUNTER — Other Ambulatory Visit: Payer: Self-pay | Admitting: Family Medicine

## 2022-03-13 DIAGNOSIS — K439 Ventral hernia without obstruction or gangrene: Secondary | ICD-10-CM

## 2022-03-19 ENCOUNTER — Other Ambulatory Visit (HOSPITAL_COMMUNITY): Payer: Self-pay

## 2022-03-20 ENCOUNTER — Other Ambulatory Visit (HOSPITAL_COMMUNITY): Payer: Self-pay

## 2022-03-20 ENCOUNTER — Ambulatory Visit
Admission: RE | Admit: 2022-03-20 | Discharge: 2022-03-20 | Disposition: A | Discharge status: Home / Self Care | Payer: No Typology Code available for payment source | Source: Ambulatory Visit | Attending: Family Medicine | Admitting: Family Medicine

## 2022-03-20 DIAGNOSIS — K439 Ventral hernia without obstruction or gangrene: Secondary | ICD-10-CM

## 2022-03-25 ENCOUNTER — Other Ambulatory Visit (HOSPITAL_COMMUNITY): Payer: Self-pay

## 2022-03-27 ENCOUNTER — Other Ambulatory Visit: Payer: Self-pay | Admitting: *Deleted

## 2022-03-27 DIAGNOSIS — K429 Umbilical hernia without obstruction or gangrene: Secondary | ICD-10-CM

## 2022-04-08 ENCOUNTER — Other Ambulatory Visit: Payer: Self-pay | Admitting: *Deleted

## 2022-04-08 DIAGNOSIS — K429 Umbilical hernia without obstruction or gangrene: Secondary | ICD-10-CM

## 2022-04-16 ENCOUNTER — Encounter: Payer: Self-pay | Admitting: General Surgery

## 2022-04-16 ENCOUNTER — Ambulatory Visit (INDEPENDENT_AMBULATORY_CARE_PROVIDER_SITE_OTHER): Payer: No Typology Code available for payment source | Admitting: General Surgery

## 2022-04-16 VITALS — BP 155/98 | HR 67 | Temp 97.7°F | Resp 14 | Ht 67.0 in | Wt 180.0 lb

## 2022-04-16 DIAGNOSIS — K429 Umbilical hernia without obstruction or gangrene: Secondary | ICD-10-CM | POA: Diagnosis not present

## 2022-04-16 NOTE — Patient Instructions (Signed)
Umbilical Hernia, Adult  A hernia is a bulge of tissue that pushes through an opening between muscles. An umbilical hernia happens in the abdomen, near the belly button (umbilicus). The hernia may contain tissues from the small intestine, large intestine, or fatty tissue covering the intestines. Umbilical hernias in adults tend to get worse over time, and they require surgical treatment. There are different types of umbilical hernias, including: Indirect hernia. This type is located just above or below the umbilicus. It is the most common type of umbilical hernia in adults. Direct hernia. This type forms through an opening formed by the umbilicus. Reducible hernia. This type of hernia comes and goes. It may be visible only when you strain, lift something heavy, or cough. This type of hernia can be pushed back into the abdomen (reduced). Incarcerated hernia. This type traps abdominal tissue inside the hernia. This type of hernia cannot be reduced. Strangulated hernia. This type of hernia cuts off blood flow to the tissues inside the hernia. The tissues can start to die if this happens. This type of hernia requires emergency treatment. What are the causes? An umbilical hernia happens when tissue inside the abdomen presses on a weak area of the abdominal muscles. What increases the risk? You may have a greater risk of this condition if you: Are obese. Have had several pregnancies. Have a buildup of fluid inside your abdomen. Have had surgery that weakens the abdominal muscles. What are the signs or symptoms? The main symptom of this condition is a painless bulge at or near the belly button. A reducible hernia may be visible only when you strain, lift something heavy, or cough. Other symptoms may include: Dull pain. A feeling of pressure. Symptoms of a strangulated hernia may include: Pain that gets increasingly worse. Nausea and vomiting. Pain when pressing on the hernia. Skin over the hernia  becoming red or purple. Constipation. Blood in the stool. How is this diagnosed? This condition may be diagnosed based on: A physical exam. You may be asked to cough or strain while standing. These actions increase the pressure inside your abdomen and can force the hernia through the opening in your muscles. Your health care provider may try to reduce the hernia by pressing on it. Your symptoms and medical history. How is this treated? Surgery is the only treatment for an umbilical hernia. Surgery for a strangulated hernia is done as soon as possible. If you have a small hernia that is not incarcerated, you may need to lose weight before having surgery. Follow these instructions at home: Lose weight, if told by your health care provider. Do not try to push the hernia back in. Watch your hernia for any changes in color or size. Tell your health care provider if any changes occur. You may need to avoid activities that increase pressure on your hernia. Do not lift anything that is heavier than 10 lb (4.5 kg), or the limit that you are told, until your health care provider says that it is safe. Take over-the-counter and prescription medicines only as told by your health care provider. Keep all follow-up visits. This is important. Contact a health care provider if: Your hernia gets larger. Your hernia becomes painful. Get help right away if: You develop sudden, severe pain near the area of your hernia. You have pain as well as nausea or vomiting. You have pain and the skin over your hernia changes color. You develop a fever or chills. Summary A hernia is a bulge of   tissue that pushes through an opening between muscles. An umbilical hernia happens near the belly button. Surgery is the only treatment for an umbilical hernia. Do not try to push your hernia back in. Keep all follow-up visits. This is important. This information is not intended to replace advice given to you by your health care  provider. Make sure you discuss any questions you have with your health care provider. Document Revised: 12/13/2019 Document Reviewed: 12/13/2019 Elsevier Patient Education  2023 [employer]  

## 2022-04-16 NOTE — Progress Notes (Unsigned)
[facility] History and Physical  Reason for Referral:*** Referring Physician: ***  Chief Complaint   New Patient (Initial Visit)     Mark Gomez is a 56 y.o. male.  HPI: ***.  The *** started *** and has had a duration of ***.  It is associated with ***.  The *** is improved with ***, and is made worse with ***.    Quality*** Context***  Past Medical History:  Diagnosis Date   Heart murmur    Hyperlipidemia    Hypertension     No past surgical history on file.  No family history on file.  Social History   Tobacco Use   Smoking status: Former    Packs/day: 0.50    Years: 6.00    Total pack years: 3.00    Types: Cigarettes    Quit date: 05/20/1990    Years since quitting: 31.9   Smokeless tobacco: Never    Medications: {medication reviewed/display:3041432} Allergies as of 04/16/2022   No Known Allergies      Medication List        Accurate as of April 16, 2022 11:57 AM. If you have any questions, ask your nurse or doctor.          STOP taking these medications    amLODipine 5 MG tablet Commonly known as: NORVASC Stopped by: Lucretia Roers, MD   cloNIDine 0.1 MG tablet Commonly known as: CATAPRES Stopped by: Lucretia Roers, MD   glycopyrrolate 1 MG tablet Commonly known as: ROBINUL Stopped by: Lucretia Roers, MD   hydrochlorothiazide 12.5 MG capsule Commonly known as: MICROZIDE Stopped by: Lucretia Roers, MD   metoprolol tartrate 100 MG tablet Commonly known as: LOPRESSOR Stopped by: Lucretia Roers, MD   metoprolol tartrate 25 MG tablet Commonly known as: LOPRESSOR Stopped by: Lucretia Roers, MD       TAKE these medications    testosterone cypionate 100 MG/ML injection Commonly known as: DEPOTESTOTERONE CYPIONATE Inject 200 mg into the muscle every 14 (fourteen) days. For IM use only What changed: Another medication with the same name was removed. Continue taking this medication, and  follow the directions you see here. Changed by: Lucretia Roers, MD         ROS:  {Review of Systems:30496}  Blood pressure (!) 155/98, pulse 67, temperature 97.7 F (36.5 C), temperature source Oral, resp. rate 14, height 5\' 7"  (1.702 m), weight 180 lb (81.6 kg), SpO2 97 %. Physical Exam  Results: No results found for this or any previous visit (from the past 48 hour(s)).  No results found.   Assessment & Plan:  Mark Gomez is a 56 y.o. male with *** -*** -*** -Follow up ***  All questions were answered to the satisfaction of the patient and family***.  The risk and benefits of *** were discussed including but not limited to ***.  After careful consideration, Mark Gomez has decided to ***.    Ignacia Marvel 04/16/2022, 11:57 AM

## 2022-05-07 ENCOUNTER — Other Ambulatory Visit (HOSPITAL_COMMUNITY): Payer: Self-pay

## 2022-07-08 ENCOUNTER — Other Ambulatory Visit (HOSPITAL_COMMUNITY): Payer: Self-pay

## 2022-07-10 ENCOUNTER — Other Ambulatory Visit (HOSPITAL_COMMUNITY): Payer: Self-pay

## 2022-07-10 MED ORDER — TESTOSTERONE CYPIONATE 200 MG/ML IM SOLN
200.0000 mg | INTRAMUSCULAR | 1 refills | Status: AC
  Filled 2022-07-10 – 2022-07-24 (×2): qty 6, 84d supply, fill #0

## 2022-07-12 ENCOUNTER — Other Ambulatory Visit (HOSPITAL_COMMUNITY): Payer: Self-pay

## 2022-07-22 ENCOUNTER — Other Ambulatory Visit (HOSPITAL_COMMUNITY): Payer: Self-pay

## 2022-07-24 ENCOUNTER — Other Ambulatory Visit (HOSPITAL_COMMUNITY): Payer: Self-pay

## 2022-07-25 ENCOUNTER — Other Ambulatory Visit (HOSPITAL_COMMUNITY): Payer: Self-pay

## 2022-10-08 ENCOUNTER — Other Ambulatory Visit (HOSPITAL_COMMUNITY): Payer: Self-pay

## 2022-10-08 DIAGNOSIS — R7303 Prediabetes: Secondary | ICD-10-CM | POA: Diagnosis not present

## 2022-10-08 DIAGNOSIS — E291 Testicular hypofunction: Secondary | ICD-10-CM | POA: Diagnosis not present

## 2022-10-08 DIAGNOSIS — Z125 Encounter for screening for malignant neoplasm of prostate: Secondary | ICD-10-CM | POA: Diagnosis not present

## 2022-10-08 MED ORDER — TESTOSTERONE CYPIONATE 200 MG/ML IM SOLN
200.0000 mg | INTRAMUSCULAR | 1 refills | Status: DC
  Filled 2022-10-08: qty 7, 90d supply, fill #0
  Filled 2022-10-15: qty 7, 98d supply, fill #0
  Filled 2022-10-16: qty 6, 84d supply, fill #0
  Filled 2023-02-03: qty 6, 84d supply, fill #1

## 2022-10-10 ENCOUNTER — Other Ambulatory Visit (HOSPITAL_COMMUNITY): Payer: Self-pay

## 2022-10-11 ENCOUNTER — Other Ambulatory Visit (HOSPITAL_COMMUNITY): Payer: Self-pay

## 2022-10-11 MED ORDER — GLYCOPYRROLATE 1 MG PO TABS
1.0000 mg | ORAL_TABLET | Freq: Two times a day (BID) | ORAL | 1 refills | Status: AC
  Filled 2022-10-11: qty 180, 90d supply, fill #0

## 2022-10-15 ENCOUNTER — Other Ambulatory Visit: Payer: Self-pay

## 2022-10-15 ENCOUNTER — Other Ambulatory Visit (HOSPITAL_COMMUNITY): Payer: Self-pay

## 2022-10-16 ENCOUNTER — Other Ambulatory Visit (HOSPITAL_COMMUNITY): Payer: Self-pay

## 2023-02-03 ENCOUNTER — Other Ambulatory Visit (HOSPITAL_COMMUNITY): Payer: Self-pay

## 2023-02-06 ENCOUNTER — Other Ambulatory Visit (HOSPITAL_COMMUNITY): Payer: Self-pay

## 2023-03-27 DIAGNOSIS — R7303 Prediabetes: Secondary | ICD-10-CM | POA: Diagnosis not present

## 2023-03-27 DIAGNOSIS — Z13220 Encounter for screening for lipoid disorders: Secondary | ICD-10-CM | POA: Diagnosis not present

## 2023-03-27 DIAGNOSIS — Z21 Asymptomatic human immunodeficiency virus [HIV] infection status: Secondary | ICD-10-CM | POA: Diagnosis not present

## 2023-03-27 DIAGNOSIS — E291 Testicular hypofunction: Secondary | ICD-10-CM | POA: Diagnosis not present

## 2023-03-27 DIAGNOSIS — Z125 Encounter for screening for malignant neoplasm of prostate: Secondary | ICD-10-CM | POA: Diagnosis not present

## 2023-04-08 DIAGNOSIS — E291 Testicular hypofunction: Secondary | ICD-10-CM | POA: Diagnosis not present

## 2023-04-08 DIAGNOSIS — Z0000 Encounter for general adult medical examination without abnormal findings: Secondary | ICD-10-CM | POA: Diagnosis not present

## 2023-04-08 DIAGNOSIS — R011 Cardiac murmur, unspecified: Secondary | ICD-10-CM | POA: Diagnosis not present

## 2023-04-08 DIAGNOSIS — Z23 Encounter for immunization: Secondary | ICD-10-CM | POA: Diagnosis not present

## 2023-04-08 DIAGNOSIS — I10 Essential (primary) hypertension: Secondary | ICD-10-CM | POA: Diagnosis not present

## 2023-04-08 DIAGNOSIS — R7303 Prediabetes: Secondary | ICD-10-CM | POA: Diagnosis not present

## 2023-04-08 DIAGNOSIS — Z1211 Encounter for screening for malignant neoplasm of colon: Secondary | ICD-10-CM | POA: Diagnosis not present

## 2023-04-08 DIAGNOSIS — E782 Mixed hyperlipidemia: Secondary | ICD-10-CM | POA: Diagnosis not present

## 2023-05-04 ENCOUNTER — Other Ambulatory Visit (HOSPITAL_COMMUNITY): Payer: Self-pay

## 2023-05-05 ENCOUNTER — Other Ambulatory Visit (HOSPITAL_COMMUNITY): Payer: Self-pay

## 2023-05-05 ENCOUNTER — Other Ambulatory Visit (INDEPENDENT_AMBULATORY_CARE_PROVIDER_SITE_OTHER): Payer: Self-pay

## 2023-05-05 ENCOUNTER — Encounter: Payer: Self-pay | Admitting: Orthopedic Surgery

## 2023-05-05 ENCOUNTER — Ambulatory Visit (INDEPENDENT_AMBULATORY_CARE_PROVIDER_SITE_OTHER): Payer: 59 | Admitting: Orthopedic Surgery

## 2023-05-05 DIAGNOSIS — M25572 Pain in left ankle and joints of left foot: Secondary | ICD-10-CM

## 2023-05-05 DIAGNOSIS — M216X2 Other acquired deformities of left foot: Secondary | ICD-10-CM

## 2023-05-05 MED ORDER — TESTOSTERONE CYPIONATE 200 MG/ML IM SOLN
200.0000 mg | INTRAMUSCULAR | 1 refills | Status: AC
  Filled 2023-05-05: qty 6, 84d supply, fill #0
  Filled 2023-07-29: qty 6, 84d supply, fill #1
  Filled 2023-10-28: qty 6, 84d supply, fill #2

## 2023-05-05 NOTE — Progress Notes (Signed)
Office Visit Note   Patient: Mark Gomez           Date of Birth: May 22, 1965           MRN: [id number] Visit Date: 05/05/2023              Requested by: Henrine Screws, MD [address] [place],  [place] [address] PCP: Henrine Screws, MD  Chief Complaint  Patient presents with   Left Foot - Pain   Left Ankle - Pain      HPI: Patient is a 57 year old gentleman who presents with left foot and ankle pain pain primarily over the base of the fourth and fifth metatarsals.  Patient states he rolls his ankle to the side.  Assessment & Plan: Visit Diagnoses:  1. Pain in left ankle and joints of left foot   2. Acquired cavovarus foot deformity, left     Plan: Recommended sole orthotics and cork cut out the great toe from the metatarsal head distally to allow for plantarflexion of the first ray to unload the lateral aspect of the foot and to decrease lateral stress on the lateral ankle ligaments.  Follow-Up Instructions: No follow-ups on file.   Ortho Exam  Patient is alert, oriented, no adenopathy, well-dressed, normal affect, normal respiratory effort. Examination patient has a good pulse he has good range of motion of the ankle and subtalar joint.  He has pain to palpation of the base of the fourth and fifth metatarsals.  Patient has increased laxity of the subtalar joint and has a positive anterior drawer with laxity of the anterior talofibular ligament.  Radiograph shows no evidence of a fracture.  Imaging: XR Ankle 2 Views Left Result Date: 05/05/2023 2 view radiographs of the left ankle shows a congruent tibiotalar joint, no osteochondral defects.  XR Foot 2 Views Left Result Date: 05/05/2023 2 view radiographs of the left foot shows a plantarflexed first ray with a cavus foot.  No bony abnormalities no joint space narrowing no fractures.  No images are attached to the encounter.  Labs: No results found for: "HGBA1C", "ESRSEDRATE", "CRP", "LABURIC",  "REPTSTATUS", "GRAMSTAIN", "CULT", "LABORGA"   No results found for: "ALBUMIN", "PREALBUMIN", "CBC"  No results found for: "MG" No results found for: "VD25OH"  No results found for: "PREALBUMIN"     No data to display           There is no height or weight on file to calculate BMI.  Orders:  Orders Placed This Encounter  Procedures   XR Foot 2 Views Left   XR Ankle 2 Views Left   No orders of the defined types were placed in this encounter.    Procedures: No procedures performed  Clinical Data: No additional findings.  ROS:  All other systems negative, except as noted in the HPI. Review of Systems  Objective: Vital Signs: There were no vitals taken for this visit.  Specialty Comments:  No specialty comments available.  PMFS History: Patient Active Problem List   Diagnosis Date Noted   Umbilical hernia without obstruction and without gangrene 04/16/2022   SOCIAL PHOBIA 02/07/2010   Past Medical History:  Diagnosis Date   Heart murmur    Hyperlipidemia    Hypertension     History reviewed. No pertinent family history.  History reviewed. No pertinent surgical history. Social History   Occupational History   Not on file  Tobacco Use   Smoking status: Former    Current packs/day: 0.00  Average packs/day: 0.5 packs/day for 6.0 years (3.0 ttl pk-yrs)    Types: Cigarettes    Start date: 05/20/1984    Quit date: 05/20/1990    Years since quitting: 32.9   Smokeless tobacco: Never  Substance and Sexual Activity   Alcohol use: Not on file   Drug use: Not on file   Sexual activity: Not on file

## 2023-05-06 ENCOUNTER — Other Ambulatory Visit (HOSPITAL_COMMUNITY): Payer: Self-pay

## 2023-05-22 ENCOUNTER — Other Ambulatory Visit (HOSPITAL_COMMUNITY): Payer: Self-pay

## 2023-05-22 MED ORDER — PEG 3350-KCL-NA BICARB-NACL 420 G PO SOLR
ORAL | 0 refills | Status: AC
  Filled 2023-05-22: qty 4000, 1d supply, fill #0

## 2023-05-22 MED ORDER — BISACODYL 5 MG PO TBEC
DELAYED_RELEASE_TABLET | ORAL | 0 refills | Status: AC
  Filled 2023-05-22: qty 4, 1d supply, fill #0

## 2023-05-23 ENCOUNTER — Other Ambulatory Visit: Payer: Self-pay

## 2023-07-29 ENCOUNTER — Other Ambulatory Visit: Payer: Self-pay

## 2023-07-29 ENCOUNTER — Other Ambulatory Visit (HOSPITAL_COMMUNITY): Payer: Self-pay

## 2023-10-27 DIAGNOSIS — E291 Testicular hypofunction: Secondary | ICD-10-CM | POA: Diagnosis not present

## 2023-10-27 DIAGNOSIS — Z125 Encounter for screening for malignant neoplasm of prostate: Secondary | ICD-10-CM | POA: Diagnosis not present

## 2023-10-28 ENCOUNTER — Other Ambulatory Visit (HOSPITAL_COMMUNITY): Payer: Self-pay

## 2023-10-28 ENCOUNTER — Other Ambulatory Visit: Payer: Self-pay

## 2023-10-28 MED ORDER — TESTOSTERONE CYPIONATE 200 MG/ML IM SOLN
200.0000 mg | INTRAMUSCULAR | 1 refills | Status: DC
  Filled 2023-10-28: qty 6, 84d supply, fill #0
  Filled 2024-02-04: qty 6, 84d supply, fill #1

## 2023-10-29 ENCOUNTER — Other Ambulatory Visit (HOSPITAL_COMMUNITY): Payer: Self-pay

## 2024-02-04 ENCOUNTER — Other Ambulatory Visit (HOSPITAL_COMMUNITY): Payer: Self-pay

## 2024-02-06 ENCOUNTER — Other Ambulatory Visit (HOSPITAL_COMMUNITY): Payer: Self-pay

## 2024-03-22 ENCOUNTER — Encounter: Payer: Self-pay | Admitting: Radiology

## 2024-04-26 DIAGNOSIS — E291 Testicular hypofunction: Secondary | ICD-10-CM | POA: Diagnosis not present

## 2024-04-26 DIAGNOSIS — Z125 Encounter for screening for malignant neoplasm of prostate: Secondary | ICD-10-CM | POA: Diagnosis not present

## 2024-05-02 ENCOUNTER — Other Ambulatory Visit (HOSPITAL_COMMUNITY): Payer: Self-pay

## 2024-05-03 ENCOUNTER — Other Ambulatory Visit (HOSPITAL_COMMUNITY): Payer: Self-pay

## 2024-05-03 MED ORDER — TESTOSTERONE CYPIONATE 200 MG/ML IM SOLN
200.0000 mg | INTRAMUSCULAR | 1 refills | Status: AC
  Filled 2024-05-03: qty 6, 84d supply, fill #0

## 2024-05-05 ENCOUNTER — Other Ambulatory Visit (HOSPITAL_COMMUNITY): Payer: Self-pay
